# Patient Record
Sex: Male | Born: 1955 | Race: White | Hispanic: No | Marital: Single | Smoking: Never smoker
Health system: Southern US, Community
[De-identification: ages and names within clinical notes are randomized; demographics above are authoritative.]

## PROBLEM LIST (undated history)

## (undated) HISTORY — PX: TENDON REPAIR: SHX5111

---

## 2000-02-03 ENCOUNTER — Ambulatory Visit (HOSPITAL_COMMUNITY): Admission: RE | Admit: 2000-02-03 | Discharge: 2000-02-03 | Payer: Self-pay | Admitting: Gastroenterology

## 2005-04-02 ENCOUNTER — Ambulatory Visit: Payer: Self-pay | Admitting: Pulmonary Disease

## 2005-04-04 ENCOUNTER — Ambulatory Visit: Payer: Self-pay | Admitting: Cardiology

## 2005-05-09 ENCOUNTER — Ambulatory Visit: Payer: Self-pay | Admitting: Pulmonary Disease

## 2005-07-08 ENCOUNTER — Ambulatory Visit: Payer: Self-pay | Admitting: Pulmonary Disease

## 2006-07-02 IMAGING — CT CT PARANASAL SINUSES LIMITED
1 of 2 series · 16 of 25 positions shown, 20 images · non-contrast
Comparison: None.

CLINICAL DATA: Nasal congestion and sinusitis.
 LIMITED CT OF PARANASAL SINUSES ? 04/04/05:
TECHNIQUE: Limited coronal CT images were obtained through the paranasal sinuses without intravenous contrast.

[Series 4: ltd sinus 3.0 h30s · axial · 0.29mm/px · z∈[-96,-1]mm · 16 of 24 slices shown, 20 images]
[im 2/24  brain]
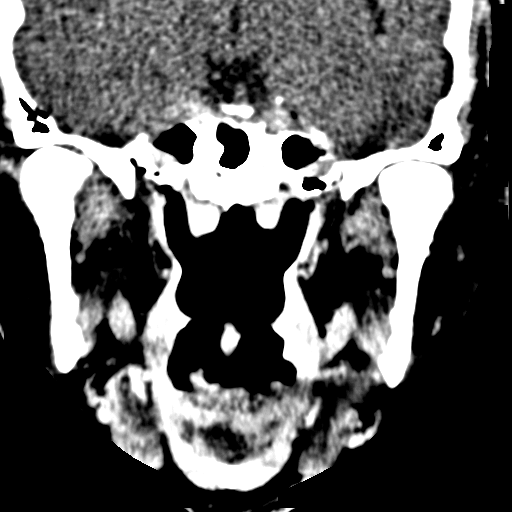
[im 2/24  bone]
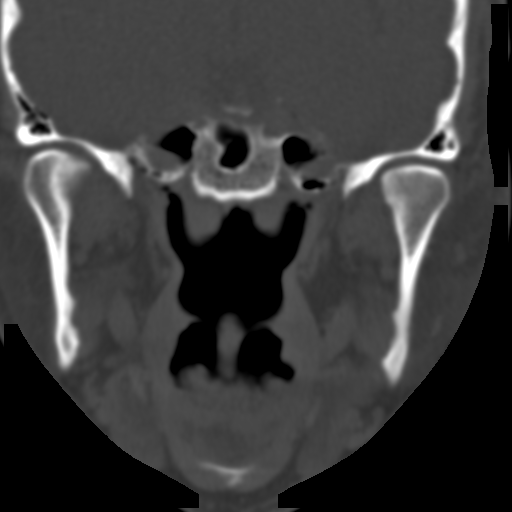
[im 4/24  bone]
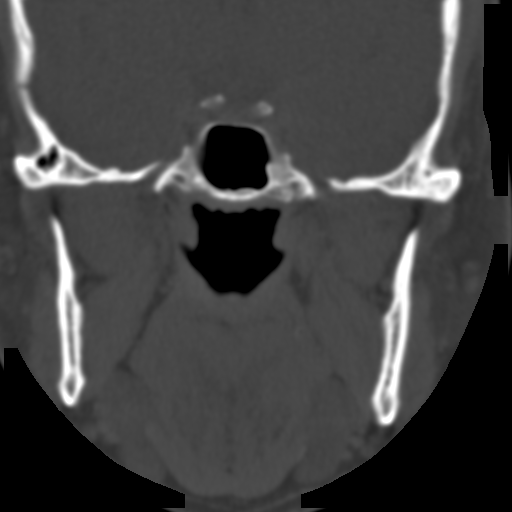
[im 5/24  bone]
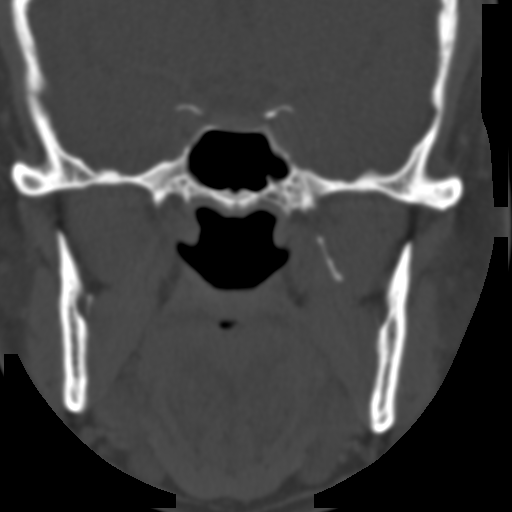
[im 6/24  bone]
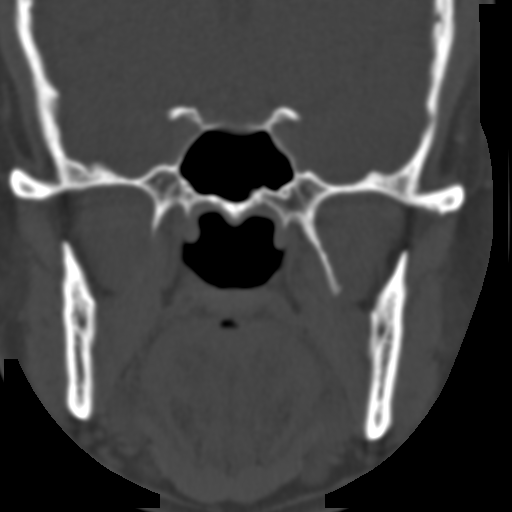
[im 8/24  brain]
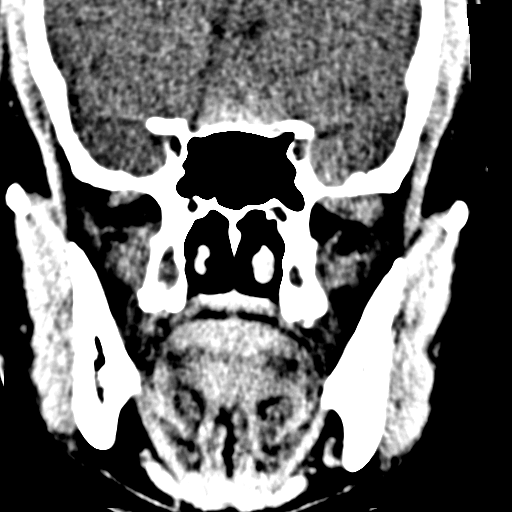
[im 8/24  bone]
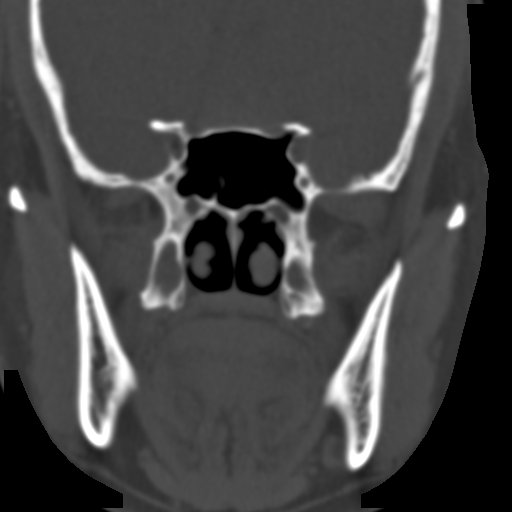
[im 9/24  bone]
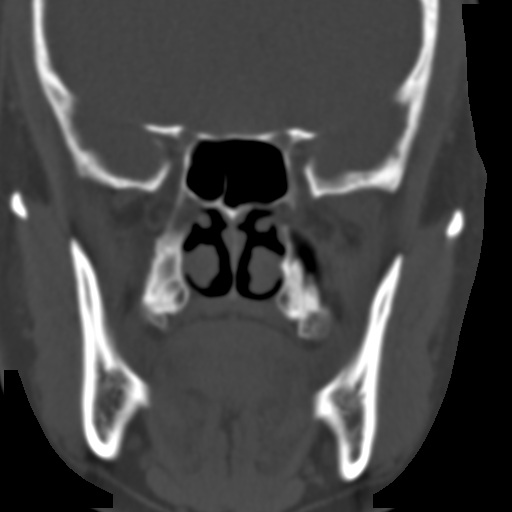
[im 10/24  bone]
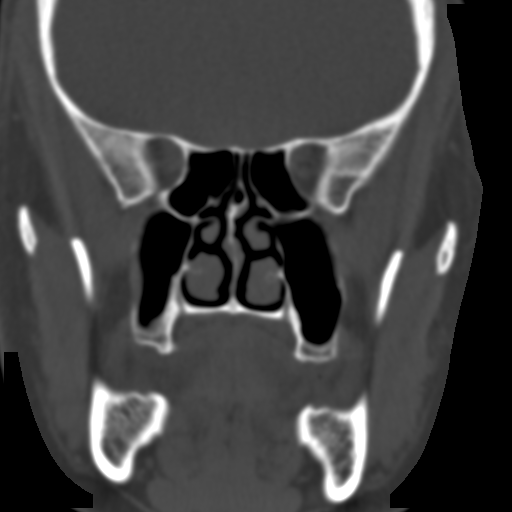
[im 12/24  bone]
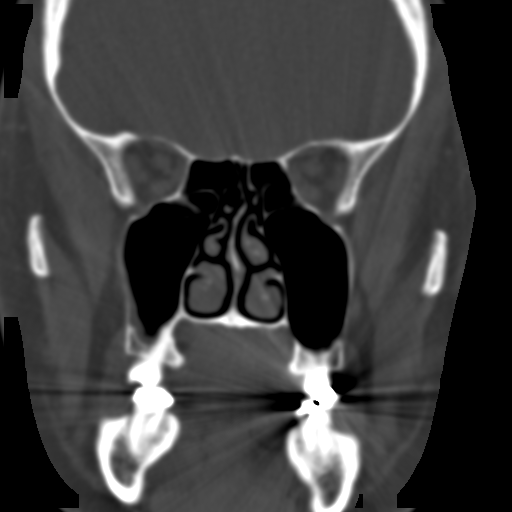
[im 13/24  brain]
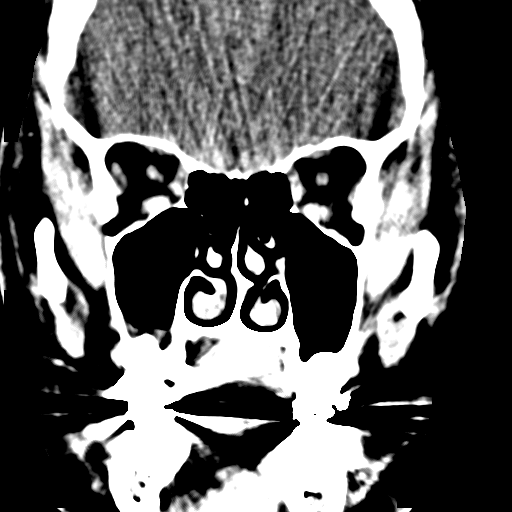
[im 13/24  bone]
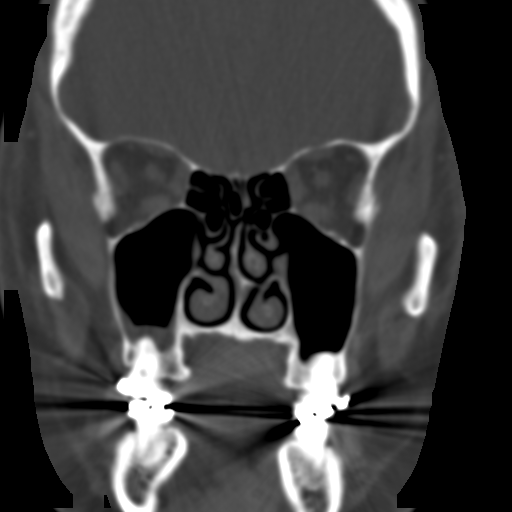
[im 15/24  bone]
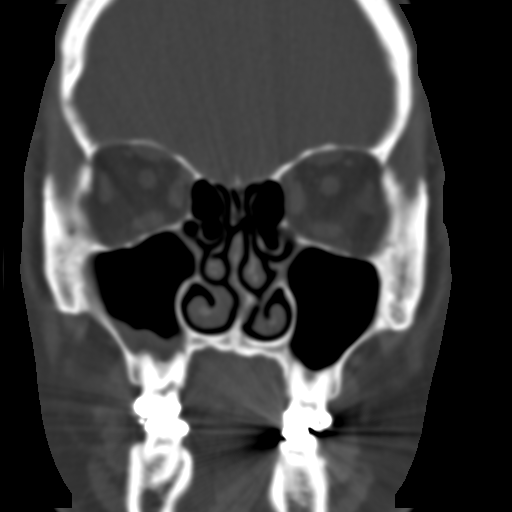
[im 16/24  bone]
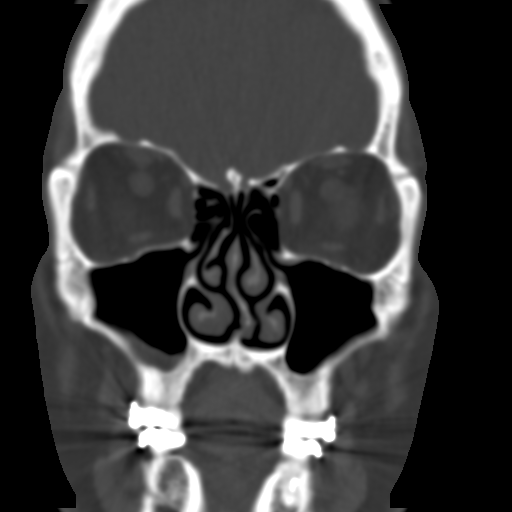
[im 17/24  bone]
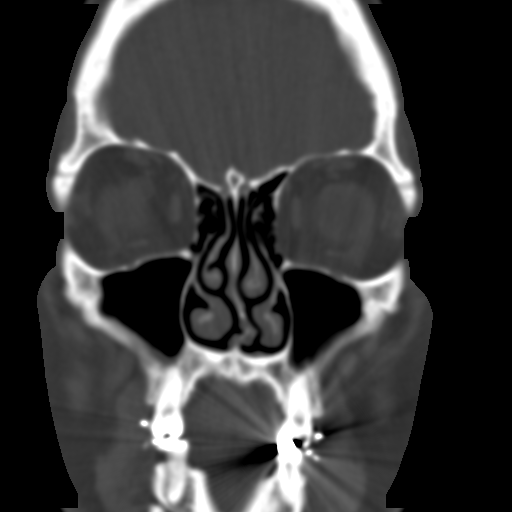
[im 19/24  brain]
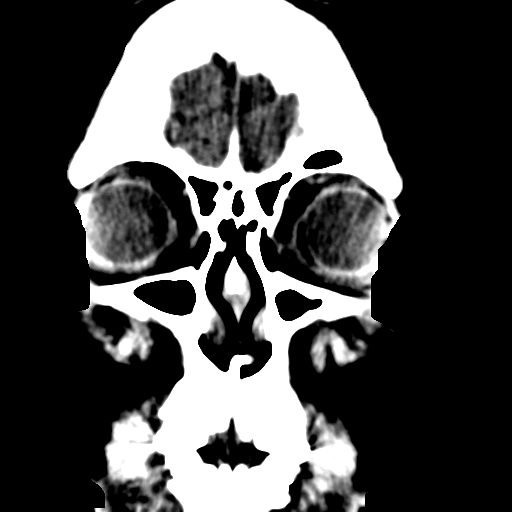
[im 19/24  bone]
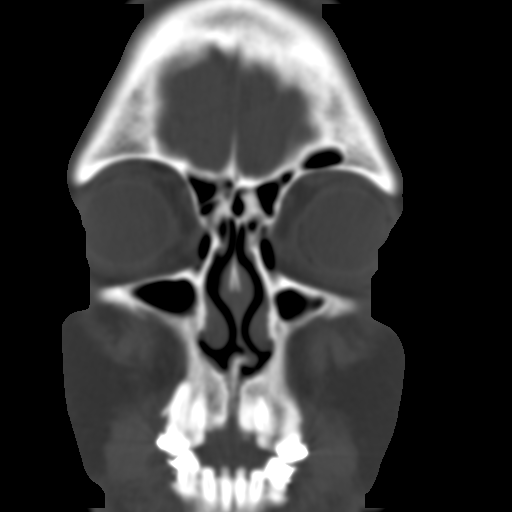
[im 20/24  bone]
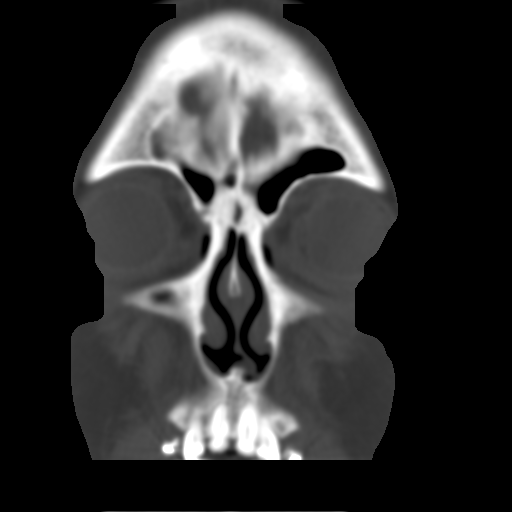
[im 21/24  bone]
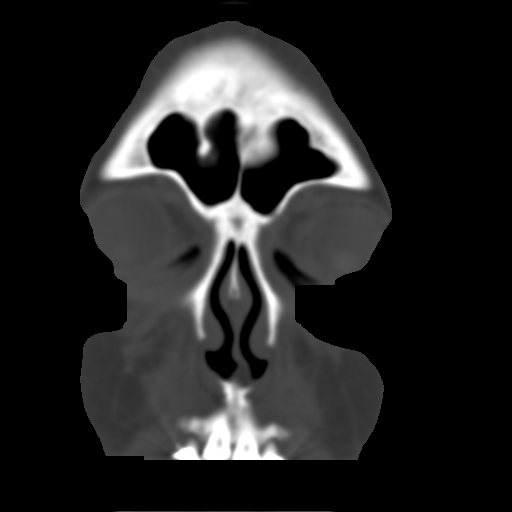
[im 23/24  bone]
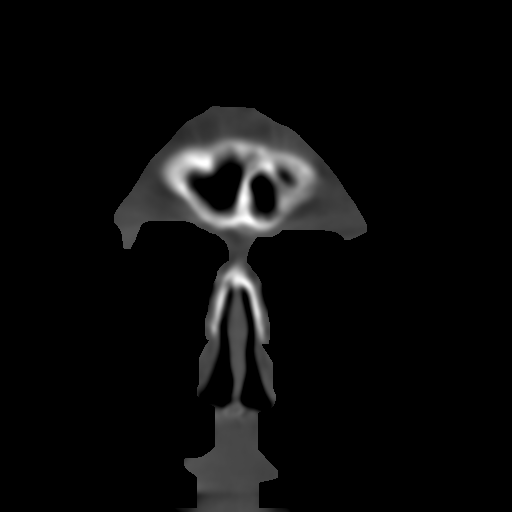

[16 of 25 positions shown; findings below may reference images not displayed]

FINDINGS: There is mild to moderate mucoperiosteal thickening affecting the right maxillary sinus.  
 The left maxillary sinus, the sphenoid sinus, the ethmoid air cells, and the frontal sinuses are normally aerated.  The ostiomeatal units are patent bilaterally.
IMPRESSION: Mild to moderate right maxillary sinus disease.

## 2007-05-04 DIAGNOSIS — R05 Cough: Secondary | ICD-10-CM

## 2007-05-04 DIAGNOSIS — K219 Gastro-esophageal reflux disease without esophagitis: Secondary | ICD-10-CM | POA: Insufficient documentation

## 2007-05-04 DIAGNOSIS — R059 Cough, unspecified: Secondary | ICD-10-CM | POA: Insufficient documentation

## 2007-05-04 DIAGNOSIS — J329 Chronic sinusitis, unspecified: Secondary | ICD-10-CM | POA: Insufficient documentation

## 2011-03-12 ENCOUNTER — Encounter (HOSPITAL_BASED_OUTPATIENT_CLINIC_OR_DEPARTMENT_OTHER)
Admission: RE | Admit: 2011-03-12 | Discharge: 2011-03-12 | Disposition: A | Discharge status: Home / Self Care | Payer: 59 | Source: Ambulatory Visit | Attending: Orthopedic Surgery | Admitting: Orthopedic Surgery

## 2011-03-12 LAB — BASIC METABOLIC PANEL
BUN: 16 mg/dL (ref 6–23)
CO2: 27 mEq/L (ref 19–32)
Calcium: 9.2 mg/dL (ref 8.4–10.5)
Chloride: 104 mEq/L (ref 96–112)
Creatinine, Ser: 1.01 mg/dL (ref 0.50–1.35)
GFR calc Af Amer: >60 mL/min (ref >60)
GFR calc non Af Amer: >60 mL/min (ref >60)
Glucose, Bld: 98 mg/dL (ref 70–99)
Potassium: 4 mEq/L (ref 3.5–5.1)
Sodium: 138 mEq/L (ref 135–145)

## 2011-03-13 ENCOUNTER — Ambulatory Visit (HOSPITAL_BASED_OUTPATIENT_CLINIC_OR_DEPARTMENT_OTHER)
Admission: RE | Admit: 2011-03-13 | Discharge: 2011-03-14 | Disposition: A | Discharge status: Home / Self Care | Payer: 59 | Source: Ambulatory Visit | Attending: Orthopedic Surgery | Admitting: Orthopedic Surgery

## 2011-03-13 DIAGNOSIS — E669 Obesity, unspecified: Secondary | ICD-10-CM | POA: Insufficient documentation

## 2011-03-13 DIAGNOSIS — W19XXXA Unspecified fall, initial encounter: Secondary | ICD-10-CM | POA: Insufficient documentation

## 2011-03-13 DIAGNOSIS — Z01812 Encounter for preprocedural laboratory examination: Secondary | ICD-10-CM | POA: Insufficient documentation

## 2011-03-13 DIAGNOSIS — S53439A Radial collateral ligament sprain of unspecified elbow, initial encounter: Secondary | ICD-10-CM | POA: Insufficient documentation

## 2011-03-13 DIAGNOSIS — S52123A Displaced fracture of head of unspecified radius, initial encounter for closed fracture: Secondary | ICD-10-CM | POA: Insufficient documentation

## 2011-03-13 DIAGNOSIS — K219 Gastro-esophageal reflux disease without esophagitis: Secondary | ICD-10-CM | POA: Insufficient documentation

## 2011-03-13 DIAGNOSIS — I10 Essential (primary) hypertension: Secondary | ICD-10-CM | POA: Insufficient documentation

## 2011-03-13 DIAGNOSIS — M24029 Loose body in unspecified elbow: Secondary | ICD-10-CM | POA: Insufficient documentation

## 2011-03-14 LAB — POCT HEMOGLOBIN-HEMACUE: Hemoglobin: 15.5 g/dL (ref 13.0–17.0)

## 2011-03-14 NOTE — Op Note (Signed)
NAMEMARCY, BOGOSIAN NO.:  [id number]  MEDICAL RECORD NO.:  [id number]  LOCATION:                                 FACILITY:  PHYSICIAN:  Katy Fitch. Meredith Mells, M.D. DATE OF BIRTH:  Jan 27, 1956  DATE OF PROCEDURE:  03/13/2011 DATE OF DISCHARGE:                              OPERATIVE REPORT   PREOPERATIVE DIAGNOSIS:  Status post fall, February 26, 2011, with x-ray suggestive of radial head fracture and possible loose body within radiocapitellar articulation.  POSTOPERATIVE DIAGNOSIS:  Complex varus elbow injury with vertical shear fracture of radial head, ulnar aspect, avulsion of origin of radial collateral ligament, partial avulsion of common extensor origin, and multiple cartilaginous loose bodies stripped from capitellum causing blocking of extension of humeral ulnar articulation.  OPERATIONS: 1. Examination of left elbow under anesthesia demonstrating persistent     flexion contracture. 2. Moderate instability of distal radioulnar joint to dorsal palmar     translation without evidence of obvious [place] Lopresti injury. 3. Exploration of lateral aspect of left elbow joint, revealing     avulsion of the proximal origin of the radial collateral ligament     and the extensor carpi radialis brevis and part of the extensor     carpi radialis longus with subsequent removal of osteocartilaginous     loose bodies and cartilaginous loose bodies from humeral ulnar     joint and radiocapitellar joint with fragments from collateral     ligament avulsion and capitellar shear hyaline cartilage injury. 4. Open reduction and internal fixation of unstable shear fracture of     radial head and neck. 5. Reconstruction of radial collateral ligament origin and common     extensor origin.  OPERATING SURGEON:  Katy Fitch. Kylan Liberati, MD  ASSISTANT:  Marveen Reeks Dasnoit, PA-C  ANESTHESIA:  General by LMA technique supplemented by a left infraclavicular block.  SUPERVISING  ANESTHESIOLOGIST:  Dr. Jean Rosenthal.  INDICATIONS:  Jeffrey Pena is a 55 year old gentleman referred through the courtesy of Dr. Elias Else, primary care physician.  In May 2012, he sustained a significant injury to his left elbow when he fell on a concrete surface sustaining a very significant injury to the elbow region.  He had immediate pain and swelling and inability to extend the elbow completely.  He was seen at an Urgent Care setting and had x-rays of the elbow demonstrating a large anterior fat pad sign and a small fragment of bone adjacent to the radial neck.  A displaced fracture of the radial head or neck was not immediately apparent.  The films were over read by radiologist and with the fat pad sign, a fracture of the radial head was inspected.  Mr. Waldrop was referred for an upper extremity orthopedic consult and on examination in the office, he was noted to have inability to extend his elbow, pain with pronation, supination, and the possibility of a significant intra-articular injury to the elbow.  We initially advised him to try gentle flexion-extension exercises without pronation, supination to see if his motion would improve after the fusion cleared.  He returned and was noted to have no improvement in his range of motion, persistent swelling, and repeat x-rays  revealed that he had an unstable vertical shear fracture of the radial head and some fracture fragments adjacent to the capitellum raising the question of a complex ligament and joint surface injury accompanying the fracture.  We recommended open reduction and internal fixation of the radial head, joint exploration, debridement, and repair of injured structures as identified.  Questions were invited and answered in the office.  He was scheduled for surgery at this time.  PROCEDURE:  Twanna Hy was brought to room 2 at the [facility] and placed in supine position on the operating  table.  In the preoperative holding area, Dr. Jairo Ben had provided detailed anesthesia informed consent and placed infraclavicular block without complication.  In room 2 under Dr. Edison Pace direct supervision, general anesthesia by LMA technique was induced followed by Betadine scrub and paint of the left upper extremity.  Two grams of Ancef were provided as an IV prophylactic antibiotic based on his weight per protocol.  A pneumatic tourniquet was applied to the proximal right brachium and after a routine preoperative time-out, the left arm was exsanguinated with Esmarch bandage and arterial tourniquet inflated to 220 mmHg.  Procedure commenced with a limited Kocher incision exposing the anconeus muscle and extensor carpi radialis longus.  By palpation, I could feel loose bodies deep to the radial collateral ligament origin.  Therefore, the incision was extended over the apex of the lateral epicondyle revealing avulsion of the extensor carpi radialis longus and brevis from the epicondyle and avulsion of the radial collateral ligament origin.  The joint was entered and multiple free fragments of cartilage were removed from the capitellar surface and radiocapitellar articulation.  Irrigation of the joint and careful inspection allowed visualization of a large fragment of cartilage that was caught between and humerus at the posterior trochlea.  This was removed with forceps and a [facility]) elevators were used to palpate the joint and all recesses looking for other loose bodies.  There was a small loose body adjacent the radial neck at the proximal radioulnar articulation.  This area was not accessible without very extreme exposure which I elected not to perform.  The radial head was exposed by release of the capsule, preserving the annular ligament.  The fracture was adjacent to the sigmoid notch. After clearing clot with a dental probe and irrigated joint thoroughly, I  was able to reduce the radial head fragment anatomically and holding in place with a dental pick, while 0.035 inch Kirschner wires were drilled parallel distal to the articular surface of the radial head.  A C-arm fluoroscope was used to confirm with fluoroscopy as well as individual images, anatomic reduction of the fracture fragment.  Two 1.5 mm ASIF screws were placed with lag technique securing the fracture fragment.  The heads of the screws were countersunk deep to the hyaline cartilage and cortex.  More than 20 views were taken as well as live fluoroscopy confirming that screw lengths were proper.  A 24 mm screw was used in the anterior position and a 22 mm screw posteriorly.  After thorough irrigation of the joint, the lateral collateral ligament origin was reconstructed by placement of a Biomet JuggerKnot anchor and suture tails were used to perform grasping suture repair of the collateral ligament anatomically followed by anatomic reconstruction of the origin of the extensor carpi radialis longus and brevis to the leading lateral epicondyle.  The interval in the capsule was repaired with figure-of-eight sutures of 3-0 FiberWire followed by repair of the  fascia covering the anconeus and extensor carpi radialis longus with a running suture of 3-0 Vicryl.  The skin was repaired with subcutaneous 3-0 Vicryl and intradermal through Prolene.  Mr. Mcmichen was placed in a well-padded long long-arm radial and ulnar slab splint with no pressure directly over the incision.  Ace wrap was used for compression wrap.  For aftercare, he was placed in a sling, awakened from general anesthesia and transferred to recovery room with stable signs.  He will discharged under the care of his wife and will return to the office for followup in 1 week.  At that time, we will advance into a Bledsoe brace and begin immediate flexion extension range of motion exercises gradually increasing his  arc of motion.  There are no apparent complications.  This injury is a complex joint surface fracture and collateral ligament complex that will require a very structured rehabilitation program.  I explained to Mr. Fogleman's wife that more likely or not he will have a permanent flexion contracture and will require 4-5 months of rehabilitation to achieve maximal medical improvement.     Katy Fitch Ludivina Guymon, M.D.     RVS/MEDQ  D:  03/13/2011  T:  03/14/2011  Job:  [id number]  cc:   Molly Maduro A. Nicholos Johns, M.D.  Electronically Signed by Josephine Igo M.D. on 03/14/2011 11:42:57 AM

## 2017-09-10 ENCOUNTER — Other Ambulatory Visit: Payer: Self-pay | Admitting: Family Medicine

## 2017-09-10 DIAGNOSIS — N6320 Unspecified lump in the left breast, unspecified quadrant: Secondary | ICD-10-CM

## 2017-09-17 ENCOUNTER — Ambulatory Visit
Admission: RE | Admit: 2017-09-17 | Discharge: 2017-09-17 | Disposition: A | Discharge status: Home / Self Care | Payer: No Typology Code available for payment source | Source: Ambulatory Visit | Attending: Family Medicine | Admitting: Family Medicine

## 2017-09-17 DIAGNOSIS — N6320 Unspecified lump in the left breast, unspecified quadrant: Secondary | ICD-10-CM

## 2023-01-12 ENCOUNTER — Encounter (HOSPITAL_BASED_OUTPATIENT_CLINIC_OR_DEPARTMENT_OTHER): Payer: Self-pay | Admitting: Orthopaedic Surgery

## 2023-01-12 NOTE — H&P (Signed)
PREOPERATIVE H&P  Chief Complaint: RIGHT DISTAL BICEPS TEAR  HPI: Jeffrey Pena is a 67 y.o. male who is scheduled for, Procedure(s): DISTAL BICEPS TENDON REPAIR.   The patient is a healthy 67 year old who sustained a right distal biceps injury while he was flipping a tire in the gym. This happened about a month ago. He had an MRI in [place]. He is here to talk about his care. 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 He is right hand dominant at baseline.   Symptoms are rated as moderate to severe, and have been worsening.  This is significantly impairing activities of daily living.    Please see clinic note for further details on this patient's care.    He has elected for surgical management.   History reviewed. No pertinent past medical history. Past Surgical History:  Procedure Laterality Date   TENDON REPAIR Left    left arm   Social History   Socioeconomic History   Marital status: Married    Spouse name: Not on file   Number of children: Not on file   Years of education: Not on file   Highest education level: Not on file  Occupational History   Not on file  Tobacco Use   Smoking status: Never   Smokeless tobacco: Never  Vaping Use   Vaping status: Never Used  Substance and Sexual Activity   Alcohol use: Yes    Comment: occasionally   Drug use: Never   Sexual activity: Not on file  Other Topics Concern   Not on file  Social History Narrative   Not on file   Social Determinants of Health   Financial Resource Strain: Not on file  Food Insecurity: Not on file  Transportation Needs: Not on file  Physical Activity: Not on file  Stress: Not on file  Social Connections: Unknown (11/05/2021)   Received [facility]   Social Network    Social Network: Not on file   History reviewed. No pertinent family history. Not on File Prior to Admission medications   Medication Sig Start Date End Date Taking? Authorizing Provider  cholecalciferol  (VITAMIN D3) 25 MCG (1000 UNIT) tablet Take 1,000 Units by mouth daily.   Yes [provider]  metFORMIN (GLUCOPHAGE-XR) 500 MG 24 hr tablet Take 500 mg by mouth daily with breakfast. Pt states he is not diabetic, taking medication for weight loss   Yes [provider]  vitamin B-12 (CYANOCOBALAMIN) 100 MCG tablet Take 100 mcg by mouth daily.   Yes [provider]    ROS: All other systems have been reviewed and were otherwise negative with the exception of those mentioned in the HPI and as above.  Physical Exam: General: Alert, no acute distress Cardiovascular: No pedal edema Respiratory: No cyanosis, no use of accessory musculature GI: No organomegaly, abdomen is soft and non-tender Skin: No lesions in the area of chief complaint Neurologic: Sensation intact distally Psychiatric: Patient is competent for consent with normal mood and affect Lymphatic: No axillary or cervical lymphadenopathy  MUSCULOSKELETAL:  The range of motion of the elbow is essentially full. He has an obvious skin tenting and a reverse Popeye consistent with distal biceps rupture.   Imaging: Right elbow MRI is reviewed and demonstrates a distal biceps rupture with 7 cm of displacement and proximal migration. There is also reportedly some change at the coranoid process, some proximal radial collateral ligament injury. The primary issue is his  distal biceps.  Assessment: RIGHT DISTAL BICEPS TEAR  Plan: Plan for Procedure(s): DISTAL BICEPS TENDON REPAIR  The risks benefits and alternatives were discussed with the patient including but not limited to the risks of nonoperative treatment, versus surgical intervention including infection, bleeding, nerve injury,  blood clots, cardiopulmonary complications, morbidity, mortality, among others, and they were willing to proceed.   The patient acknowledged the explanation, agreed to proceed with the plan and consent was signed.   Operative Plan:  Right distal biceps tendon repair Discharge Medications: standard DVT Prophylaxis: none Physical Therapy: outpatient PT Special Discharge needs: Splint. Sling.    Vernetta Honey, PA-C  01/12/2023 3:45 PM

## 2023-01-14 NOTE — Anesthesia Preprocedure Evaluation (Signed)
Anesthesia Evaluation  Patient identified by MRN, date of birth, ID band Patient awake    Reviewed: Allergy & Precautions, NPO status , Patient's Chart, lab work & pertinent test results  Airway Mallampati: II  TM Distance: >3 FB Neck ROM: Full    Dental no notable dental hx. (+) Teeth Intact, Dental Advisory Given   Pulmonary neg pulmonary ROS   Pulmonary exam normal breath sounds clear to auscultation       Cardiovascular negative cardio ROS Normal cardiovascular exam Rhythm:Regular Rate:Normal     Neuro/Psych negative neurological ROS     GI/Hepatic ,GERD  Controlled,,  Endo/Other    Renal/GU      Musculoskeletal  (+) Arthritis ,    Abdominal   Peds  Hematology   Anesthesia Other Findings   Reproductive/Obstetrics                              Anesthesia Physical Anesthesia Plan  ASA: 1  Anesthesia Plan: General   Post-op Pain Management: Regional block*, Minimal or no pain anticipated, Tylenol PO (pre-op)* and Precedex   Induction: Intravenous  PONV Risk Score and Plan: 3 and Treatment may vary due to age or medical condition, Midazolam, Dexamethasone and Ondansetron  Airway Management Planned: Oral ETT  Additional Equipment: None  Intra-op Plan:   Post-operative Plan: Extubation in OR  Informed Consent: I have reviewed the patients History and Physical, chart, labs and discussed the procedure including the risks, benefits and alternatives for the proposed anesthesia with the patient or authorized representative who has indicated his/her understanding and acceptance.     Dental advisory given  Plan Discussed with: CRNA  Anesthesia Plan Comments: (GA w R ISB)        Anesthesia Quick Evaluation

## 2023-01-15 ENCOUNTER — Encounter (HOSPITAL_BASED_OUTPATIENT_CLINIC_OR_DEPARTMENT_OTHER)
Admission: RE | Disposition: A | Discharge status: Home / Self Care | Payer: Self-pay | Source: Home / Self Care | Attending: Orthopaedic Surgery

## 2023-01-15 ENCOUNTER — Ambulatory Visit (HOSPITAL_BASED_OUTPATIENT_CLINIC_OR_DEPARTMENT_OTHER): Payer: No Typology Code available for payment source | Admitting: Certified Registered"

## 2023-01-15 ENCOUNTER — Ambulatory Visit (HOSPITAL_BASED_OUTPATIENT_CLINIC_OR_DEPARTMENT_OTHER)
Admission: RE | Admit: 2023-01-15 | Discharge: 2023-01-15 | Disposition: A | Discharge status: Home / Self Care | Payer: No Typology Code available for payment source | Attending: Orthopaedic Surgery | Admitting: Orthopaedic Surgery

## 2023-01-15 ENCOUNTER — Other Ambulatory Visit: Payer: Self-pay

## 2023-01-15 ENCOUNTER — Encounter (HOSPITAL_BASED_OUTPATIENT_CLINIC_OR_DEPARTMENT_OTHER): Payer: Self-pay | Admitting: Orthopaedic Surgery

## 2023-01-15 DIAGNOSIS — S46211A Strain of muscle, fascia and tendon of other parts of biceps, right arm, initial encounter: Secondary | ICD-10-CM | POA: Insufficient documentation

## 2023-01-15 DIAGNOSIS — Z419 Encounter for procedure for purposes other than remedying health state, unspecified: Secondary | ICD-10-CM

## 2023-01-15 DIAGNOSIS — X58XXXA Exposure to other specified factors, initial encounter: Secondary | ICD-10-CM | POA: Insufficient documentation

## 2023-01-15 DIAGNOSIS — S46201A Unspecified injury of muscle, fascia and tendon of other parts of biceps, right arm, initial encounter: Secondary | ICD-10-CM

## 2023-01-15 HISTORY — PX: DISTAL BICEPS TENDON REPAIR: SHX1461

## 2023-01-15 SURGERY — REPAIR, TENDON, BICEPS, DISTAL
Anesthesia: General | Site: Arm Upper | Laterality: Right

## 2023-01-15 MED ORDER — OXYCODONE HCL 5 MG PO TABS
5.0000 mg | ORAL_TABLET | Freq: Once | ORAL | Status: AC | PRN
  Administered 2023-01-15: 5 mg via ORAL

## 2023-01-15 MED ORDER — FENTANYL CITRATE (PF) 100 MCG/2ML IJ SOLN
INTRAMUSCULAR | Status: DC | PRN
  Administered 2023-01-15: 100 ug via INTRAVENOUS

## 2023-01-15 MED ORDER — PHENYLEPHRINE HCL-NACL 20-0.9 MG/250ML-% IV SOLN
INTRAVENOUS | Status: DC | PRN
  Administered 2023-01-15: 25 ug/min via INTRAVENOUS

## 2023-01-15 MED ORDER — KETOROLAC TROMETHAMINE 30 MG/ML IJ SOLN: 30.0000 mg | Freq: Once | INTRAMUSCULAR | Status: DC | PRN

## 2023-01-15 MED ORDER — ACETAMINOPHEN 500 MG PO TABS: 1000.0000 mg | ORAL_TABLET | Freq: Three times a day (TID) | ORAL | 0 refills | Status: AC

## 2023-01-15 MED ORDER — DEXAMETHASONE SODIUM PHOSPHATE 10 MG/ML IJ SOLN
INTRAMUSCULAR | Status: AC
  Filled 2023-01-15: qty 1

## 2023-01-15 MED ORDER — MIDAZOLAM HCL 2 MG/2ML IJ SOLN
2.0000 mg | Freq: Once | INTRAMUSCULAR | Status: AC
  Administered 2023-01-15: 2 mg via INTRAVENOUS

## 2023-01-15 MED ORDER — OXYCODONE HCL 5 MG/5ML PO SOLN: 5.0000 mg | Freq: Once | ORAL | Status: AC | PRN

## 2023-01-15 MED ORDER — ROCURONIUM BROMIDE 100 MG/10ML IV SOLN
INTRAVENOUS | Status: DC | PRN
  Administered 2023-01-15: 70 mg via INTRAVENOUS

## 2023-01-15 MED ORDER — ONDANSETRON HCL 4 MG PO TABS: 4.0000 mg | ORAL_TABLET | Freq: Three times a day (TID) | ORAL | 0 refills | Status: AC | PRN

## 2023-01-15 MED ORDER — DEXAMETHASONE SODIUM PHOSPHATE 10 MG/ML IJ SOLN
INTRAMUSCULAR | Status: DC | PRN
  Administered 2023-01-15: 10 mg via INTRAVENOUS

## 2023-01-15 MED ORDER — CELECOXIB 100 MG PO CAPS: 100.0000 mg | ORAL_CAPSULE | Freq: Two times a day (BID) | ORAL | 0 refills | Status: AC

## 2023-01-15 MED ORDER — FENTANYL CITRATE (PF) 100 MCG/2ML IJ SOLN
INTRAMUSCULAR | Status: AC
  Filled 2023-01-15: qty 2

## 2023-01-15 MED ORDER — GABAPENTIN 100 MG PO CAPS: 100.0000 mg | ORAL_CAPSULE | Freq: Three times a day (TID) | ORAL | 0 refills | Status: AC

## 2023-01-15 MED ORDER — ONDANSETRON HCL 4 MG/2ML IJ SOLN
INTRAMUSCULAR | Status: AC
  Filled 2023-01-15: qty 2

## 2023-01-15 MED ORDER — BUPIVACAINE LIPOSOME 1.3 % IJ SUSP
INTRAMUSCULAR | Status: DC | PRN
  Administered 2023-01-15: 10 mL via PERINEURAL

## 2023-01-15 MED ORDER — CEFAZOLIN SODIUM-DEXTROSE 2-4 GM/100ML-% IV SOLN
INTRAVENOUS | Status: AC
  Filled 2023-01-15: qty 100

## 2023-01-15 MED ORDER — MIDAZOLAM HCL 2 MG/2ML IJ SOLN
INTRAMUSCULAR | Status: AC
  Filled 2023-01-15: qty 2

## 2023-01-15 MED ORDER — ONDANSETRON HCL 4 MG/2ML IJ SOLN
INTRAMUSCULAR | Status: DC | PRN
Start: 2023-01-15 — End: 2023-01-15
  Administered 2023-01-15: 4 mg via INTRAVENOUS

## 2023-01-15 MED ORDER — CEFAZOLIN SODIUM-DEXTROSE 2-4 GM/100ML-% IV SOLN
2.0000 g | INTRAVENOUS | Status: AC
  Administered 2023-01-15: 2 g via INTRAVENOUS

## 2023-01-15 MED ORDER — PHENYLEPHRINE 80 MCG/ML (10ML) SYRINGE FOR IV PUSH (FOR BLOOD PRESSURE SUPPORT)
PREFILLED_SYRINGE | INTRAVENOUS | Status: AC
  Filled 2023-01-15: qty 10

## 2023-01-15 MED ORDER — OXYCODONE HCL 5 MG PO TABS: ORAL_TABLET | ORAL | 0 refills | Status: AC

## 2023-01-15 MED ORDER — VANCOMYCIN HCL 1000 MG IV SOLR
INTRAVENOUS | Status: DC | PRN
  Administered 2023-01-15: 1000 mg via TOPICAL

## 2023-01-15 MED ORDER — PROPOFOL 10 MG/ML IV BOLUS
INTRAVENOUS | Status: AC
  Filled 2023-01-15: qty 20

## 2023-01-15 MED ORDER — FENTANYL CITRATE (PF) 100 MCG/2ML IJ SOLN
100.0000 ug | Freq: Once | INTRAMUSCULAR | Status: AC
  Administered 2023-01-15: 100 ug via INTRAVENOUS

## 2023-01-15 MED ORDER — SUGAMMADEX SODIUM 200 MG/2ML IV SOLN
INTRAVENOUS | Status: DC | PRN
  Administered 2023-01-15: 200 mg via INTRAVENOUS

## 2023-01-15 MED ORDER — PROPOFOL 10 MG/ML IV BOLUS
INTRAVENOUS | Status: DC | PRN
  Administered 2023-01-15: 320 mg via INTRAVENOUS
  Administered 2023-01-15: 30 mg via INTRAVENOUS

## 2023-01-15 MED ORDER — ONDANSETRON HCL 4 MG/2ML IJ SOLN: 4.0000 mg | Freq: Once | INTRAMUSCULAR | Status: DC | PRN

## 2023-01-15 MED ORDER — PHENYLEPHRINE HCL (PRESSORS) 10 MG/ML IV SOLN
INTRAVENOUS | Status: DC | PRN
  Administered 2023-01-15: 240 ug via INTRAVENOUS
  Administered 2023-01-15: 80 ug via INTRAVENOUS
  Administered 2023-01-15: 160 ug via INTRAVENOUS
  Administered 2023-01-15: 240 ug via INTRAVENOUS
  Administered 2023-01-15: 80 ug via INTRAVENOUS

## 2023-01-15 MED ORDER — OXYCODONE HCL 5 MG PO TABS
ORAL_TABLET | ORAL | Status: AC
  Filled 2023-01-15: qty 1

## 2023-01-15 MED ORDER — EPHEDRINE SULFATE (PRESSORS) 50 MG/ML IJ SOLN
INTRAMUSCULAR | Status: DC | PRN
  Administered 2023-01-15: 10 mg via INTRAVENOUS
  Administered 2023-01-15: 5 mg via INTRAVENOUS
  Administered 2023-01-15 (×3): 10 mg via INTRAVENOUS

## 2023-01-15 MED ORDER — PHENYLEPHRINE HCL (PRESSORS) 10 MG/ML IV SOLN
INTRAVENOUS | Status: AC
  Filled 2023-01-15: qty 1

## 2023-01-15 MED ORDER — BUPIVACAINE HCL (PF) 0.5 % IJ SOLN
INTRAMUSCULAR | Status: DC | PRN
  Administered 2023-01-15: 16 mL via PERINEURAL

## 2023-01-15 MED ORDER — LACTATED RINGERS IV SOLN: INTRAVENOUS | Status: DC

## 2023-01-15 MED ORDER — EPHEDRINE 5 MG/ML INJ
INTRAVENOUS | Status: AC
  Filled 2023-01-15: qty 5

## 2023-01-15 MED ORDER — ACETAMINOPHEN 500 MG PO TABS
1000.0000 mg | ORAL_TABLET | Freq: Once | ORAL | Status: AC
  Administered 2023-01-15: 1000 mg via ORAL

## 2023-01-15 MED ORDER — ACETAMINOPHEN 500 MG PO TABS
ORAL_TABLET | ORAL | Status: AC
  Filled 2023-01-15: qty 2

## 2023-01-15 MED ORDER — GABAPENTIN 300 MG PO CAPS
ORAL_CAPSULE | ORAL | Status: AC
  Filled 2023-01-15: qty 1

## 2023-01-15 MED ORDER — HYDROMORPHONE HCL 1 MG/ML IJ SOLN: 0.2500 mg | INTRAMUSCULAR | Status: DC | PRN

## 2023-01-15 MED ORDER — GABAPENTIN 300 MG PO CAPS
300.0000 mg | ORAL_CAPSULE | Freq: Once | ORAL | Status: AC
  Administered 2023-01-15: 300 mg via ORAL

## 2023-01-15 MED ORDER — ROCURONIUM BROMIDE 10 MG/ML (PF) SYRINGE
PREFILLED_SYRINGE | INTRAVENOUS | Status: AC
  Filled 2023-01-15: qty 10

## 2023-01-15 MED ORDER — ACETAMINOPHEN 500 MG PO TABS: 1000.0000 mg | ORAL_TABLET | Freq: Once | ORAL | Status: DC

## 2023-01-15 MED ORDER — 0.9 % SODIUM CHLORIDE (POUR BTL) OPTIME
TOPICAL | Status: DC | PRN
  Administered 2023-01-15: 200 mL

## 2023-01-15 SURGICAL SUPPLY — 69 items
ANCH BUTTON FIBERTAK KNEE SP (Anchor) IMPLANT
ANCH SUT 5 FBRTK 2.6 KNTLS SLF (Anchor) ×1 IMPLANT
ANCH SUT FBRTK BTN KN (Anchor) ×1 IMPLANT
ANCHOR SUT FBRTK 2.6 SP #5 (Anchor) IMPLANT
APL PRP STRL LF DISP 70% ISPRP (MISCELLANEOUS) ×1
BLADE SURG 10 STRL SS (BLADE) ×1 IMPLANT
BLADE SURG 15 STRL LF DISP TIS (BLADE) ×1 IMPLANT
BLADE SURG 15 STRL SS (BLADE) ×1
BNDG CMPR 5X4 KNIT ELC UNQ LF (GAUZE/BANDAGES/DRESSINGS) ×1
BNDG CMPR 9X4 STRL LF SNTH (GAUZE/BANDAGES/DRESSINGS) ×1
BNDG ELASTIC 4INX 5YD STR LF (GAUZE/BANDAGES/DRESSINGS) ×1 IMPLANT
BNDG ESMARK 4X9 LF (GAUZE/BANDAGES/DRESSINGS) ×1 IMPLANT
CHLORAPREP W/TINT 26 (MISCELLANEOUS) ×1 IMPLANT
CLSR STERI-STRIP ANTIMIC 1/2X4 (GAUZE/BANDAGES/DRESSINGS) ×1 IMPLANT
CORD BIPOLAR FORCEPS 12FT (ELECTRODE) ×1 IMPLANT
CUFF TOURN SGL QUICK 18X3 (MISCELLANEOUS) ×1 IMPLANT
CUFF TOURN SGL QUICK 24 (TOURNIQUET CUFF)
CUFF TRNQT CYL 24X4X16.5-23 (TOURNIQUET CUFF) IMPLANT
DRAPE EXTREMITY T 121X128X90 (DISPOSABLE) ×1 IMPLANT
DRAPE INCISE IOBAN 66X45 STRL (DRAPES) IMPLANT
DRAPE OEC MINIVIEW 54X84 (DRAPES) IMPLANT
DRAPE U-SHAPE 47X51 STRL (DRAPES) ×1 IMPLANT
ELECT REM PT RETURN 9FT ADLT (ELECTROSURGICAL) ×1
ELECTRODE REM PT RTRN 9FT ADLT (ELECTROSURGICAL) ×1 IMPLANT
EXT HOSE W/PLC CONNECTION (MISCELLANEOUS) ×1
EXTENSION HOSE W/PLC CONNECTON (MISCELLANEOUS) ×1 IMPLANT
GAUZE SPONGE 4X4 12PLY STRL (GAUZE/BANDAGES/DRESSINGS) ×1 IMPLANT
GLOVE BIO SURGEON STRL SZ 6.5 (GLOVE) ×1 IMPLANT
GLOVE BIO SURGEON STRL SZ8 (GLOVE) ×1 IMPLANT
GLOVE BIOGEL PI IND STRL 6.5 (GLOVE) ×1 IMPLANT
GLOVE BIOGEL PI IND STRL 8 (GLOVE) ×1 IMPLANT
GLOVE ECLIPSE 8.0 STRL XLNG CF (GLOVE) ×1 IMPLANT
GOWN STRL REUS W/ TWL LRG LVL3 (GOWN DISPOSABLE) ×2 IMPLANT
GOWN STRL REUS W/TWL LRG LVL3 (GOWN DISPOSABLE) ×2
GOWN STRL REUS W/TWL XL LVL3 (GOWN DISPOSABLE) ×1 IMPLANT
GRAFT TISS ANT TIB TNDN (Tissue) IMPLANT
KIT KNEE FIBERTAK DISP (KITS) IMPLANT
KIT STR SPEAR 1.8 FBRTK DISP (KITS) IMPLANT
NS IRRIG 1000ML POUR BTL (IV SOLUTION) ×1 IMPLANT
PACK ARTHROSCOPY DSU (CUSTOM PROCEDURE TRAY) ×1 IMPLANT
PACK BASIN DAY SURGERY FS (CUSTOM PROCEDURE TRAY) ×1 IMPLANT
PAD CAST 4YDX4 CTTN HI CHSV (CAST SUPPLIES) ×2 IMPLANT
PADDING CAST COTTON 4X4 STRL (CAST SUPPLIES) ×2
PENCIL SMOKE EVACUATOR (MISCELLANEOUS) ×1 IMPLANT
SHEET MEDIUM DRAPE 40X70 STRL (DRAPES) ×1 IMPLANT
SLEEVE SCD COMPRESS KNEE MED (STOCKING) ×1 IMPLANT
SLING ARM FOAM STRAP LRG (SOFTGOODS) ×1 IMPLANT
SLING ARM FOAM STRAP XLG (SOFTGOODS) IMPLANT
SPIKE FLUID TRANSFER (MISCELLANEOUS) IMPLANT
SPLINT PLASTER CAST FAST 5X30 (CAST SUPPLIES) ×10 IMPLANT
SPONGE T-LAP 18X18 ~~LOC~~+RFID (SPONGE) ×1 IMPLANT
SUCTION TUBE FRAZIER 10FR DISP (SUCTIONS) IMPLANT
SUT 2 FIBERLOOP 20 STRT BLUE (SUTURE) ×1
SUT FIBERWIRE #2 38 T-5 BLUE (SUTURE) ×1
SUT MNCRL AB 4-0 PS2 18 (SUTURE) ×1 IMPLANT
SUT VIC AB 0 CT1 27 (SUTURE) ×1
SUT VIC AB 0 CT1 27XBRD ANBCTR (SUTURE) ×1 IMPLANT
SUT VIC AB 2-0 CT1 27 (SUTURE)
SUT VIC AB 2-0 CT1 TAPERPNT 27 (SUTURE) IMPLANT
SUT VIC AB 2-0 SH 27 (SUTURE) ×1
SUT VIC AB 2-0 SH 27XBRD (SUTURE) ×1 IMPLANT
SUT VIC AB 3-0 SH 27 (SUTURE)
SUT VIC AB 3-0 SH 27X BRD (SUTURE) IMPLANT
SUTURE 2 FIBERLOOP 20 STRT BLU (SUTURE) IMPLANT
SUTURE FIBERWR #2 38 T-5 BLUE (SUTURE) IMPLANT
SYR BULB EAR ULCER 3OZ GRN STR (SYRINGE) ×1 IMPLANT
TENDON ANTERIOR TIBIALIS (Tissue) ×1 IMPLANT
TOWEL GREEN STERILE FF (TOWEL DISPOSABLE) ×1 IMPLANT
TUBE SUCTION HIGH CAP CLEAR NV (SUCTIONS) ×1 IMPLANT

## 2023-01-15 NOTE — Op Note (Addendum)
Orthopaedic Surgery Operative Note (CSN: [id number])  Jeffrey Pena  1956-01-06 Date of Surgery: 01/15/2023   Diagnoses:  Right distal biceps tear, subacute  Procedure: Right distal biceps reconstruction with allograft interposition graft - modifier 22   Operative Finding Successful completion of the planned procedure.    Using a distal tibial graft made this case much more difficult than standard.  We had used a modifier 22 to represent this.  Patient reported his injury about 5 to 6 weeks ago however his tendon was extremely retracted about 8 cm and the tendon itself was quite poor quality.  It had the appearance of wispy tissue and was clearly not sufficient for repair on its own.  Based on this I felt that graft reconstruction was necessary to provide reasonable fixation.  We initially attempted to use the lacertus fibrosis however it was not sufficient in size.  We thus used a tibialis anterior allograft which we weaved into the biceps at the muscle tendon junction.  We had good fixation with a stable construct at 20 to 30 short of full extension.  Post-operative plan: The patient will be nonweightbearing in a splint for a week transition to a hinged brace which we will not start therapy until at least 2 weeks out.  The patient will be discharged home.  DVT prophylaxis not indicated in this ambulatory upper extremity patient without significant risk factors.   Pain control with PRN pain medication preferring oral medicines.  Follow up plan will be scheduled in approximately 7 days for incision check and XR.  Post-Op Diagnosis: Same Surgeons:Primary: Bjorn Pippin, MD Assistants:Caroline McBane PA-C Location: [facility] OR ROOM 6 Anesthesia: General with regional anesthesia Antibiotics: Ancef 2 g with local vancomycin powder 1 g at the surgical site Tourniquet time:  Total Tourniquet Time Documented: Upper Arm (Right) - 58 minutes Total: Upper Arm (Right) - 58 minutes  Estimated Blood  Loss: Minimal Complications: None Specimens: None Implants: Implant Name Type Inv. Item Serial No. Manufacturer Lot No. LRB No. Used Action  TENDON ANTERIOR TIBIALIS - [id number] Tissue TENDON ANTERIOR TIBIALIS [id number] [facility] [id number] Right 1 Implanted  [facility] SUT 5 FBRTK 2.6 KNTLS SLF - [id number] Anchor ANCH SUT 5 FBRTK 2.6 KNTLS SLF  [employer] [id number] Right 1 Implanted  [facility] SUT FBRTK BTN KN - [id number] Anchor ANCH SUT [employer] [id number] Right 1 Implanted    Indications for Surgery:   Jeffrey Pena is a 67 y.o. male with distal biceps rupture subacute with significant retraction.  Benefits and risks of operative and nonoperative management were discussed prior to surgery with patient/guardian(s) and informed consent form was completed.  Specific risks including infection, need for additional surgery, continued stiffness, neurovascular injury, heterotopic bone formation amongst others.   Procedure:   The patient was identified properly. Informed consent was obtained and the surgical site was marked. The patient was taken up to suite where general anesthesia was induced.  The patient was positioned prone on a hand table.  The right elbow was prepped and draped in the usual sterile fashion.  Timeout was performed before the beginning of the case.  Tourniquet was used for the above duration.  We made a transverse 4 cm incision about 3 cm distal to the elbow crease and sharply incised the skin achieving hemostasis as we progressed.  We dissected the superficial subcutaneous tissue bluntly with a scissor isolating the mobilizing any superficial veins.  At this point we identified the interval between  the pronator and the brachial radialis and bluntly dissected with finger dissection.  The lateral antebrachial cutaneous nerve was noted running along the radial border of the radial border of approach the approach with great care to avoid excess radially based  retraction to avoid damage to this nerve.    The tendon itself was quite retracted and we had to make accessory longitudinal incision over the tendon stump which is palpable in the skin about 6 cm proximal to the flexion crease.  At that point we mobilized the tissue and had good visualization of the stump of the tendon.  There was very little tendon that was appropriate for repair.  We tried to mobilize lacertus fibrosis to use this as a graft and augment our tendon however it was quite thin and damaged as well.  After assessing the tendon for an extensive amount of time based on the retraction the patient would likely have failed with a repair of this alone.  We felt that augmenting with a allograft was appropriate.  We extended our 2 incisions connecting them centrally taking care not to transversely across the flexion crease of the antecubital fossa.  We mobilized the tissue protecting a full-thickness skin flap.  We were careful not to sacrifice the major branches of the venous structures in the antecubital fossa.  Once made appropriate visualization we mobilized the bicep separating from the deep brachialis.  The lateral antebrachial cutaneous nerve was noted to be transversing from medial to lateral and we protected this.  We mobilized the biceps appropriately and at that point prepared it for grafting.  We weaved a allograft tibialis anterior 6 mm in diameter through the distal muscle tendon junction and a Pulvertaft weave type style.  We then used multiple suture tape sutures to stick tie the graft to the native tissue.  We then ran a 0 Vicryl along the native tendon as well as her augment tendon.  We had a stable construct with good tension and fixation on the biceps itself.  At that point we whipstitched the tendon and graft together taking care to check her length and cutting the tendon the appropriate length.  At this point the biceps graft was pulled with traction using an Allis clamp and were  able to mobilize it and identified that it had good excursion.    We used a #2 FiberWire style fiberloop and from proximal to distal performed a locking Krakw type suture moving in the Ziomek.  At this point we proceeded to place blunt retractors around the radial tuberosity and maximally supinate the arm to bring the tuberosity into view.  Was cleared of soft tissue and we were able to place a 2.6 mm spade tipped wire unicortically.  We then placed a 2.6 biceps fibertak Ohmann.  We flipped it intramedullary and checked its deployment pulling back on the sutures.  We then placed a knotless 2.6 mm fibertak just 6-8 mm proximal on the tuberosity.  We checked its fixation after placement.   At that point we shuttled the fiberloop sutures through the corresponding passing sutures for the fibertak Truss and then were able to shuttle the tendon down to bone.  We passed one limb through the tendon and tied alternating half hitches securing the tendon.  We then used the knotless 2.6 fibertak working limb to obtain fixation 6-8 mm proximal on the tendon and were able to secure a large footprint of tendon on bone which had been previously prepared for healing.  We cut  excess suture and were happy with the construct.   We irrigated the wound copiously before placing local antibiotic as listed above.  We closed the incision in a multilayer fashion with absorbable suture.  Sterile dressing was placed.  Splint was placed.  Patient was awoken taken to PACU in stable condition.  Alfonse Alpers, PA-C, present and scrubbed throughout the case, critical for completion in a timely fashion, and for retraction, instrumentation, closure.

## 2023-01-15 NOTE — Discharge Instructions (Addendum)
Ramond Marrow MD, MPH Alfonse Alpers, PA-C [facility] [address] [phone] (tel)   [phone] (fax)   POST-OPERATIVE INSTRUCTIONS   WOUND CARE Please keep splint clean dry and intact until followup.  You may shower on Post-Op Day #3.  You must keep splint dry during this process and may find that a plastic bag taped around the extremity or alternatively a towel based bath may be a better option.   If you get your splint wet or if it is damaged please contact our clinic.  EXERCISES Due to your splint being in place you will not be able to bear weight through your extremity.    You may use a sling for comfort   It is normal for your fingers/hand to become more swollen after surgery and discolored from bruising.   This will resolve over the first few weeks usually after surgery. Please continue to ambulate and do not stay sitting or lying for too long.  Perform foot and wrist pumps to assist in circulation.   REGIONAL ANESTHESIA (NERVE BLOCKS) The anesthesia team may have performed a nerve block for you this is a great tool used to minimize pain.   The block may start wearing off overnight (between 8-24 hours postop) When the block wears off, your pain may go from nearly zero to the pain you would have had postop without the block. This is an abrupt transition but nothing dangerous is happening.   This can be a challenging period but utilize your as needed pain medications to try and manage this period. We suggest you use the pain medication the first night prior to going to bed, to ease this transition.  You may take an extra dose of narcotic when this happens if needed   POST-OP MEDICATIONS- Multimodal approach to pain control In general your pain will be controlled with a combination of substances.  Prescriptions unless otherwise discussed are electronically sent to your pharmacy.  This is a carefully made plan we use to minimize  narcotic use.     Celebrex - Anti-inflammatory medication taken on a scheduled basis Acetaminophen - Non-narcotic pain medicine taken on a scheduled basis  Gabapentin - this is to help with nerve based pain, take on a scheduled basis  Oxycodone - This is a strong narcotic, to be used only on an "as needed" basis for SEVERE pain. Zofran - take as needed for nausea   FOLLOW-UP If you develop a Fever (>101.5), Redness or Drainage from the surgical incision site, please call our office to arrange for an evaluation. Please call the office to schedule a follow-up appointment for your incision check if you do not already have one, 7-10 days post-operatively.   HELPFUL INFORMATION    You may be more comfortable sleeping in a semi-seated position the first few nights following surgery.  Keep a pillow propped under the elbow and forearm for comfort.  If you have a recliner type of chair it might be beneficial.  If not that is fine too, but it would be helpful to sleep propped up with pillows behind your operated shoulder as well under your elbow and forearm.  This will reduce pulling on the suture lines.   When dressing, put your operative arm in the sleeve first.  When getting undressed, take your operative arm out last.  Loose fitting, [place]-down shirts are recommended.  Often in the first days after surgery you may be more comfortable keeping your operative arm under  your shirt and not through the sleeve.   You may return to work/school in the next couple of days when you feel up to it.  Desk work and typing in the sling is fine.   We suggest you use the pain medication the first night prior to going to bed, in order to ease any pain when the anesthesia wears off. You should avoid taking pain medications on an empty stomach as it will make you nauseous.   You should wean off your narcotic medicines as soon as you are able.  Most patients will be off narcotics before their first postop appointment.     Do not drink alcoholic beverages or take illicit drugs when taking pain medications.   It is against the law to drive while taking narcotics.  In some states it is against the law to drive while your arm is in a sling.    Pain medication may make you constipated.  Below are a few solutions to try in this order:   - Decrease the amount of pain medication if you aren't having pain.   - Drink lots of decaffeinated fluids.   - Drink prune juice and/or each dried prunes   If the first 3 don't work start with additional solutions   - Take Colace - an over-the-counter stool softener   - Take Senokot - an over-the-counter laxative   - Take Miralax - a stronger over-the-counter laxative   For more information including helpful videos and documents visit our website:   [email or web address]    Post Anesthesia Home Care Instructions  Activity: Get plenty of rest for the remainder of the day. A responsible individual must stay with you for 24 hours following the procedure.  For the next 24 hours, DO NOT: -Drive a car -Advertising copywriter -Drink alcoholic beverages -Take any medication unless instructed by your physician -Make any legal decisions or sign important papers.  Meals: Start with liquid foods such as gelatin or soup. Progress to regular foods as tolerated. Avoid greasy, spicy, heavy foods. If nausea and/or vomiting occur, drink only clear liquids until the nausea and/or vomiting subsides. Call your physician if vomiting continues.  Special Instructions/Symptoms: Your throat may feel dry or sore from the anesthesia or the breathing tube placed in your throat during surgery. If this causes discomfort, gargle with warm salt water. The discomfort should disappear within 24 hours.  If you had a scopolamine patch placed behind your ear for the management of post- operative nausea and/or vomiting:  1. The medication in the patch is effective for 72  hours, after which it should be removed.  Wrap patch in a tissue and discard in the trash. Wash hands thoroughly with soap and water. 2. You may remove the patch earlier than 72 hours if you experience unpleasant side effects which may include dry mouth, dizziness or visual disturbances. 3. Avoid touching the patch. Wash your hands with soap and water after contact with the patch.    1. The medication in the patch is effective for 72 hours, after which it should be removed.  Wrap patch in a tissue and discard in the trash. Wash hands thoroughly with soap and water. 2. You may remove the patch earlier than 72 hours if you experience unpleasant side effects which may include dry mouth, dizziness or visual disturbances. 3. Avoid touching the patch. Wash your hands with soap and water after contact with the patch.  Regional Anesthesia Blocks  1. Numbness or the inability to  move the "blocked" extremity may last from 3-48 hours after placement. The length of time depends on the medication injected and your individual response to the medication. If the numbness is not going away after 48 hours, call your surgeon.  2. The extremity that is blocked will need to be protected until the numbness is gone and the  Strength has returned. Because you cannot feel it, you will need to take extra care to avoid injury. Because it may be weak, you may have difficulty moving it or using it. You may not know what position it is in without looking at it while the block is in effect.  3. For blocks in the legs and feet, returning to weight bearing and walking needs to be done carefully. You will need to wait until the numbness is entirely gone and the strength has returned. You should be able to move your leg and foot normally before you try and bear weight or walk. You will need someone to be with you when you first try to ensure you do not fall and possibly risk injury.  4. Bruising and tenderness at the needle site are  common side effects and will resolve in a few days.  5. Persistent numbness or new problems with movement should be communicated to the surgeon or the [facility] [phone] [facility] [phone]).Information for Discharge Teaching: EXPAREL (bupivacaine liposome injectable suspension)   Your surgeon or anesthesiologist gave you EXPAREL(bupivacaine) to help control your pain after surgery.  EXPAREL is a local anesthetic that provides pain relief by numbing the tissue around the surgical site. EXPAREL is designed to release pain medication over time and can control pain for up to 72 hours. Depending on how you respond to EXPAREL, you may require less pain medication during your recovery.  Possible side effects: Temporary loss of sensation or ability to move in the area where bupivacaine was injected. Nausea, vomiting, constipation Rarely, numbness and tingling in your mouth or lips, lightheadedness, or anxiety may occur. Call your doctor right away if you think you may be experiencing any of these sensations, or if you have other questions regarding possible side effects.  Follow all other discharge instructions given to you by your surgeon or nurse. Eat a healthy diet and drink plenty of water or other fluids.  If you return to the hospital for any reason within 96 hours following the administration of EXPAREL, it is important for health care providers to know that you have received this anesthetic. A teal colored band has been placed on your arm with the date, time and amount of EXPAREL you have received in order to alert and inform your health care providers. Please leave this armband in place for the full 96 hours following administration, and then you may remove the band.Information for Discharge Teaching: EXPAREL (bupivacaine liposome injectable suspension)   Your surgeon or anesthesiologist gave you EXPAREL(bupivacaine) to help control your pain after  surgery.  EXPAREL is a local anesthetic that provides pain relief by numbing the tissue around the surgical site. EXPAREL is designed to release pain medication over time and can control pain for up to 72 hours. Depending on how you respond to EXPAREL, you may require less pain medication during your recovery.  Possible side effects: Temporary loss of sensation or ability to move in the area where bupivacaine was injected. Nausea, vomiting, constipation Rarely, numbness and tingling in your mouth or lips, lightheadedness, or anxiety may occur. Call your doctor  right away if you think you may be experiencing any of these sensations, or if you have other questions regarding possible side effects.  Follow all other discharge instructions given to you by your surgeon or nurse. Eat a healthy diet and drink plenty of water or other fluids.  If you return to the hospital for any reason within 96 hours following the administration of EXPAREL, it is important for health care providers to know that you have received this anesthetic. A teal colored band has been placed on your arm with the date, time and amount of EXPAREL you have received in order to alert and inform your health care providers. Please leave this armband in place for the full 96 hours following administration, and then you may remove the band.  May have Tylenol today after 4:30 PM

## 2023-01-15 NOTE — Progress Notes (Signed)
Assisted Dr. Valma Cava with right, interscalene , ultrasound guided block. Side rails up, monitors on throughout procedure. See vital signs in flow sheet. Tolerated Procedure well.

## 2023-01-15 NOTE — Anesthesia Postprocedure Evaluation (Signed)
Anesthesia Post Note  Patient: Jeffrey Pena  Procedure(s) Performed: DISTAL BICEPS TENDON REPAIR (Right: Arm Upper)     Patient location during evaluation: PACU Anesthesia Type: General and Regional Level of consciousness: awake and alert Pain management: pain level controlled Vital Signs Assessment: post-procedure vital signs reviewed and stable Respiratory status: spontaneous breathing, nonlabored ventilation, respiratory function stable and patient connected to nasal cannula oxygen Cardiovascular status: blood pressure returned to baseline and stable Postop Assessment: no apparent nausea or vomiting Anesthetic complications: no   No notable events documented.  Last Vitals:  Vitals:   01/15/23 1246 01/15/23 1251  BP: (!) 160/96 (!) 156/108  Pulse: (!) 103 (!) 102  Resp: 17 18  Temp:  (!) 36.4 C  SpO2: 94% 92%    Last Pain:  Vitals:   01/15/23 1251  TempSrc:   PainSc: 5                  Trevor Iha

## 2023-01-15 NOTE — Anesthesia Procedure Notes (Signed)
Anesthesia Regional Block: Interscalene brachial plexus block   Pre-Anesthetic Checklist: , timeout performed,  Correct Patient, Correct Site, Correct Laterality,  Correct Procedure, Correct Position, site marked,  Risks and benefits discussed,  Surgical consent,  Pre-op evaluation,  At surgeon's request and post-op pain management  Laterality: Upper and Right  Prep: Maximum Sterile Barrier Precautions used, chloraprep       Needles:  Injection technique: Single-shot  Needle Type: Echogenic Needle     Needle Length: 5cm  Needle Gauge: 21     Additional Needles:   Procedures:,,,, ultrasound used (permanent image in chart),,    Narrative:  Start time: 01/15/2023 9:21 AM End time: 01/15/2023 9:28 AM Injection made incrementally with aspirations every 5 mL.  Performed by: Personally  Anesthesiologist: Trevor Iha, MD  Additional Notes: Block assessed prior to procedure. Patient tolerated procedure well.

## 2023-01-15 NOTE — Transfer of Care (Signed)
Immediate Anesthesia Transfer of Care Note  Patient: Jeffrey Pena  Procedure(s) Performed: DISTAL BICEPS TENDON REPAIR (Right: Arm Upper)  Patient Location: PACU  Anesthesia Type:GA combined with regional for post-op pain  Level of Consciousness: drowsy  Airway & Oxygen Therapy: Patient Spontanous Breathing and Patient connected to face mask oxygen  Post-op Assessment: Report given to RN and Post -op Vital signs reviewed and stable  Post vital signs: Reviewed and stable  Last Vitals:  Vitals Value Taken Time  BP 171/107 (119)   Temp    Pulse 92 01/15/23 1222  Resp 13 01/15/23 1222  SpO2 97 % 01/15/23 1222  Vitals shown include unfiled device data.  Last Pain:  Vitals:   01/15/23 0824  TempSrc: Oral  PainSc: 0-No pain      Patients Stated Pain Goal: 6 (01/15/23 0824)  Complications: No notable events documented.

## 2023-01-15 NOTE — Anesthesia Procedure Notes (Signed)
Procedure Name: Intubation Date/Time: 01/15/2023 10:37 AM  Performed by: Lauralyn Primes, CRNAPre-anesthesia Checklist: Patient identified, Emergency Drugs available, Suction available and Patient being monitored Patient Re-evaluated:Patient Re-evaluated prior to induction Oxygen Delivery Method: Circle system utilized Preoxygenation: Pre-oxygenation with 100% oxygen Induction Type: IV induction Ventilation: Mask ventilation without difficulty and Oral airway inserted - appropriate to patient size Laryngoscope Size: Mac and 4 Grade View: Grade I Tube type: Oral Tube size: 7.5 mm Number of attempts: 1 Airway Equipment and Method: Stylet, Oral airway and Bite block Placement Confirmation: ETT inserted through vocal cords under direct vision, positive ETCO2 and breath sounds checked- equal and bilateral Secured at: 24 cm Tube secured with: Tape Dental Injury: Teeth and Oropharynx as per pre-operative assessment

## 2023-01-15 NOTE — Interval H&P Note (Signed)
All questions answered, patient wants to proceed with procedure. ? ?

## 2023-01-16 ENCOUNTER — Encounter (HOSPITAL_BASED_OUTPATIENT_CLINIC_OR_DEPARTMENT_OTHER): Payer: Self-pay | Admitting: Orthopaedic Surgery

## 2023-06-29 ENCOUNTER — Ambulatory Visit (INDEPENDENT_AMBULATORY_CARE_PROVIDER_SITE_OTHER): Payer: Self-pay | Admitting: Podiatry

## 2023-06-29 ENCOUNTER — Encounter: Payer: Self-pay | Admitting: Podiatry

## 2023-06-29 DIAGNOSIS — S91115A Laceration without foreign body of left lesser toe(s) without damage to nail, initial encounter: Secondary | ICD-10-CM

## 2023-06-29 DIAGNOSIS — S96919A Strain of unspecified muscle and tendon at ankle and foot level, unspecified foot, initial encounter: Secondary | ICD-10-CM

## 2023-06-29 NOTE — Progress Notes (Signed)
 Subjective:   Patient ID: Jeffrey Pena, male   DOB: 68 y.o.   MRN: [id number]   HPI Chief Complaint  Patient presents with   Foot Injury    Rm#12 Left foot injury severed a tendon on the base of second toe per [facility].   68 year old male presents the office today with the above concerns.  States this morning he stepped on a champagne glass coming the bottom of his foot, second toe.  He was seen in urgent care today Steri-Strips were placed.  His tetanus is up-to-date.  Presents today for follow-up right evaluation given lacerated flexor tendon, laceration.  He was prescribed antibiotics as well as pain medication at the urgent care but he has not yet picked them up.  Review of Systems  All other systems reviewed and are negative.  History reviewed. No pertinent past medical history.  Past Surgical History:  Procedure Laterality Date   DISTAL BICEPS TENDON REPAIR Right 01/15/2023   Procedure: DISTAL BICEPS TENDON REPAIR;  Surgeon: Cristy Bonner DASEN, MD;  Location: [facility];  Service: Orthopedics;  Laterality: Right;   TENDON REPAIR Left    left arm     Current Outpatient Medications:    cholecalciferol (VITAMIN D3) 25 MCG (1000 UNIT) tablet, Take 1,000 Units by mouth daily., Disp: , Rfl:    metFORMIN (GLUCOPHAGE-XR) 500 MG 24 hr tablet, Take 500 mg by mouth daily with breakfast. Pt states he is not diabetic, taking medication for weight loss, Disp: , Rfl:    vitamin B-12 (CYANOCOBALAMIN) 100 MCG tablet, Take 100 mcg by mouth daily., Disp: , Rfl:   No Known Allergies        Objective:  Physical Exam  General: AAO x3, NAD  Dermatological: Full-thickness transverse laceration to the left second toe.  Flexor tendon appears to be lacerated and able to visualize the ends.  There is no purulence or any drainage other than bleeding.  No signs of infection at  Vascular: Dorsalis Pedis artery and Posterior Tibial artery pedal pulses are 2/4 bilateral with immedate  capillary fill time.  There is no pain with calf compression, swelling, warmth, erythema.   Neruologic: Grossly intact via light touch bilateral.   Musculoskeletal: Decreased motion to the left second toe, tenderness to left second toe.      Assessment:   68 year old male laceration left second toe with flexor injury     Plan:  -Treatment options discussed including all alternatives, risks, and complications -Etiology of symptoms were discussed -Patient already had x-rays earlier today.  Present states no foreign body on x-ray. -For the laceration we discussed repair of the laceration today in the office vs going to the OR.  This appeared to be a clean cut and lead due to agreed to proceed with repair in the office today.  We discussed the risks of this including further surgery, unable to repair the flexor tendon, infection, need for further surgery, delayed healing.  Consent was signed.  I initially prepped the skin with alcohol and 3 mL lidocaine, Marcaine  plain was infiltrated in a digital block fashion.  Additional 2 mL was infiltrated.  Once the toe was inspected it was prepped with Betadine.  I started by irrigating the wound copiously with saline.  I was able to identify the flexor tendon and utilizing 3-0 Vicryl suture I did try to repair this tendon.  It.  The suture was holding although the tendon was somewhat friable.  Once the tendon was repaired I  again irrigated and then closed the laceration with 5-0 nylon without tension.  Xeroform was applied followed by dressing.  Tolerated procedure well any complications.  Postprocedure instructions discussed. -Remain in surgical shoe. -If further concern about the laceration of the flexor tendon we will order an MRI but we will see how this does and I will follow with him in about 10 days. -Monitor for any clinical signs or symptoms of infection and directed to call the office immediately should any occur or go to the ER.  Return in about  10 days (around 07/09/2023).  Donnice JONELLE Fees DPM

## 2023-07-07 ENCOUNTER — Ambulatory Visit (INDEPENDENT_AMBULATORY_CARE_PROVIDER_SITE_OTHER): Payer: Medicare Other | Admitting: Podiatry

## 2023-07-07 ENCOUNTER — Encounter: Payer: Self-pay | Admitting: Podiatry

## 2023-07-07 DIAGNOSIS — S91115D Laceration without foreign body of left lesser toe(s) without damage to nail, subsequent encounter: Secondary | ICD-10-CM

## 2023-07-08 NOTE — Progress Notes (Signed)
 Subjective: Chief Complaint  Patient presents with   Routine Post Op    RM#13 Left foot follow up suture removal patient states tries not to move his toe but is doing well.   68 year old male presents the office with above concerns.  He stopped wearing his shoe.  He does not significant pain to the toe he is been taking the antibiotics and denies any swelling or redness.  No drainage.  No fever or chills.  No other concerns.  Objective: AAO x3, NAD DP/PT pulses palpable bilaterally, CRT less than 3 seconds Surgical Procedures intact with distal send motion across the incision noted.  The toe was sitting rectus and compared to contralateral extremity appears to be about the same as far as positioning. No pain with calf compression, swelling, warmth, erythema  Assessment: Status post laceration of the flexor tendon repair  Plan: -All treatment options discussed with the patient including all alternatives, risks, complications.  -Clinically seems to doing well until 3 months.  We did hold off on MRI at this time and continue to observe clinically.  I did not remove the sutures today we will plan to do this next week.  Small of antibiotic ointment and a bandage was applied.  Continue daily dressing changes.  Ice, elevation. -Patient encouraged to call the office with any questions, concerns, change in symptoms.   Donnice JONELLE Fees DPM

## 2023-07-14 ENCOUNTER — Encounter: Payer: Self-pay | Admitting: Podiatry

## 2023-07-14 ENCOUNTER — Ambulatory Visit (INDEPENDENT_AMBULATORY_CARE_PROVIDER_SITE_OTHER): Payer: Medicare Other | Admitting: Podiatry

## 2023-07-14 DIAGNOSIS — S91115D Laceration without foreign body of left lesser toe(s) without damage to nail, subsequent encounter: Secondary | ICD-10-CM

## 2023-07-14 NOTE — Progress Notes (Unsigned)
subjective: Chief Complaint  Patient presents with   Foot Injury    RM#13 Follow up left foot injury suture removal.   68 year old male presents the office with above concerns.  He presents today for suture removal. He states he has been doing well, no concerns. No pain. He is wearing his regular shoe. No other concerns.   Objective: AAO x3, NAD- wearing regular shoes.  DP/PT pulses palpable bilaterally, CRT less than 3 seconds Incisions healing well and is coapted with sutures.  There is no evidence of dehiscence.  There is no sign of edema is no erythema.  Toe is rectus.  There is no pain on exam. No pain with calf compression, swelling, warmth, erythema  Assessment: Status post laceration of the flexor tendon repair  Plan: -All treatment options discussed with the patient including all alternatives, risks, complications.  -Suture removed today.  Incisions healing well.  He is back on a regular shoe.  We discussed buddy splint in the toe which I did today to help further stabilize the toe to help facilitate soft tissue healing.  At this time is not of any pain toes rectus or hold off on MRI but should he have symptoms I will order this.  He is in agreement this plan.  Return in about 4 weeks (around 08/11/2023).  Vivi Barrack DPM

## 2023-07-14 NOTE — Progress Notes (Unsigned)
 Jeffrey Pena
